# Patient Record
Sex: Female | Born: 1955 | Race: White | Hispanic: No | Marital: Married | Smoking: Never smoker
Health system: Southern US, Community
[De-identification: ages and names within clinical notes are randomized; demographics above are authoritative.]

---

## 2003-04-28 ENCOUNTER — Emergency Department (HOSPITAL_COMMUNITY): Admission: EM | Admit: 2003-04-28 | Discharge: 2003-04-29 | Payer: Self-pay | Admitting: Emergency Medicine

## 2003-05-04 ENCOUNTER — Emergency Department (HOSPITAL_COMMUNITY): Admission: EM | Admit: 2003-05-04 | Discharge: 2003-05-04 | Payer: Self-pay

## 2003-05-07 ENCOUNTER — Ambulatory Visit (HOSPITAL_COMMUNITY): Admission: RE | Admit: 2003-05-07 | Discharge: 2003-05-07 | Payer: Self-pay | Admitting: Internal Medicine

## 2004-03-01 ENCOUNTER — Ambulatory Visit: Payer: Self-pay

## 2004-03-08 ENCOUNTER — Ambulatory Visit (HOSPITAL_COMMUNITY): Admission: RE | Admit: 2004-03-08 | Discharge: 2004-03-08 | Payer: Self-pay | Admitting: Cardiology

## 2004-03-08 ENCOUNTER — Encounter: Payer: Self-pay | Admitting: Cardiology

## 2007-09-16 ENCOUNTER — Encounter: Admission: RE | Admit: 2007-09-16 | Discharge: 2007-09-16 | Payer: Self-pay | Admitting: Otolaryngology

## 2007-10-02 ENCOUNTER — Ambulatory Visit (HOSPITAL_COMMUNITY): Admission: RE | Admit: 2007-10-02 | Discharge: 2007-10-02 | Payer: Self-pay | Admitting: Otolaryngology

## 2007-10-09 ENCOUNTER — Inpatient Hospital Stay (HOSPITAL_COMMUNITY): Admission: AD | Admit: 2007-10-09 | Discharge: 2007-10-12 | Payer: Self-pay | Admitting: Otolaryngology

## 2008-01-27 ENCOUNTER — Encounter: Admission: RE | Admit: 2008-01-27 | Discharge: 2008-01-27 | Payer: Self-pay | Admitting: Family Medicine

## 2008-02-07 ENCOUNTER — Encounter: Admission: RE | Admit: 2008-02-07 | Discharge: 2008-02-07 | Payer: Self-pay | Admitting: Gastroenterology

## 2008-05-11 ENCOUNTER — Ambulatory Visit (HOSPITAL_COMMUNITY): Admission: RE | Admit: 2008-05-11 | Discharge: 2008-05-11 | Payer: Self-pay | Admitting: General Surgery

## 2008-07-28 ENCOUNTER — Encounter: Admission: RE | Admit: 2008-07-28 | Discharge: 2008-07-28 | Payer: Self-pay | Admitting: Family Medicine

## 2008-10-08 ENCOUNTER — Encounter: Admission: RE | Admit: 2008-10-08 | Discharge: 2008-10-08 | Payer: Self-pay | Admitting: Family Medicine

## 2009-03-27 HISTORY — PX: CHOLECYSTECTOMY: SHX55

## 2009-10-05 ENCOUNTER — Encounter: Admission: RE | Admit: 2009-10-05 | Discharge: 2009-10-05 | Payer: Self-pay | Admitting: Family Medicine

## 2010-04-17 ENCOUNTER — Encounter: Payer: Self-pay | Admitting: General Surgery

## 2010-04-17 ENCOUNTER — Encounter: Payer: Self-pay | Admitting: Otolaryngology

## 2010-04-18 ENCOUNTER — Encounter: Payer: Self-pay | Admitting: Family Medicine

## 2010-07-11 ENCOUNTER — Encounter: Payer: Self-pay | Admitting: Internal Medicine

## 2010-07-21 ENCOUNTER — Ambulatory Visit (INDEPENDENT_AMBULATORY_CARE_PROVIDER_SITE_OTHER): Payer: PRIVATE HEALTH INSURANCE | Admitting: Internal Medicine

## 2010-07-21 ENCOUNTER — Encounter: Payer: Self-pay | Admitting: Internal Medicine

## 2010-07-21 VITALS — Ht 62.0 in | Wt 104.0 lb

## 2010-07-21 DIAGNOSIS — R002 Palpitations: Secondary | ICD-10-CM | POA: Insufficient documentation

## 2010-07-21 NOTE — Progress Notes (Signed)
HPI: Barbara Gamble is a 55 y.o. female Seen at her own request after a hiatus of 7 years.  We had seen her before because of tachycardia palpitations. These were abrupt in onset and offset. Event recorder strips had suggested a long RP mechanism and has a few of these he had a P-wave morphologies are quite consistent with sinus. 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 And because of concerns about panic, she was referred icteric. She was started on Klonopin at that time and she remains on it still.  She had no symptoms for a lon  g time. In middle of February she was walking some children 2 art class. She had the abrupt onset of near syncope that was immediately followed by palpitations. This persisted for some 20 minutes. The nurse took her heart rate about 2 or 3 minutes into this episode and it was 120. She's had no further symptoms.  Current Outpatient Prescriptions  Medication Sig Dispense Refill  . Cholecalciferol (VITAMIN D3) 1000 UNITS CAPS Take by mouth.        . clonazePAM (KLONOPIN) 0.5 MG tablet Take 0.5 mg by mouth at bedtime as needed.        . docusate sodium (COLACE) 100 MG capsule Take 100 mg by mouth 2 (two) times daily.        . lansoprazole (PREVACID) 15 MG capsule Take 15 mg by mouth daily. As needed alt occ with aciphex       . psyllium (METAMUCIL) 58.6 % powder Take 1 packet by mouth daily.        . RABEprazole (ACIPHEX) 20 MG tablet Take 20 mg by mouth daily.          Allergies  Allergen Reactions  . Amoxicillin   . Zithromax (Azithromycin Dihydrate)     No past medical history on file.  No past surgical history on file.  No family history on file.  History   Social History  . Marital Status: Married    Spouse Name: N/A    Number of Children: N/A  . Years of Education: N/A   Occupational History  . Not on file.    Social History Main Topics  . Smoking status: Never Smoker   . Smokeless tobacco: Never Used  . Alcohol Use: Not on file  . Drug Use: Not on file  . Sexually Active: Not on file   Other Topics Concern  . Not on file   Social History Narrative  . No narrative on file   Past medical history is noted for GE reflux disease Past surgical history is negative. 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 She has one daughter   Fourteen point review of systems was negative except as noted in HPI and PMH   PHYSICAL EXAMINATION  Height 5\' 2"  (1.575 m), weight 104 lb (47.174 kg).   Well developed and nourished middle-age Caucasian female appearing her stated age HENT normal Neck supple with JVP-flat Carotids brisk and full without bruits Back without scoliosis or kyphosis Clear Regular rate and rhythm, no murmurs or gallops Abd-soft with active BS without hepatomegaly or midline pulsation Femoral pulses 2+ distal pulses intact No Clubbing cyanosis edema Skin-warm and dry LN-neg submandibular and supraclavicular A & Oriented CN 3-12 normal  Grossly normal sensory and motor function Affect engaging .  ECG  Sinus 99 .13/.08/.43 RAE-unchanged 2005

## 2010-07-21 NOTE — Assessment & Plan Note (Signed)
The patient had an episode of palpitations. It was self-limited. It is the first in many years. 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 There is a great deal of co-occurring anxiety suggesting that the latter was a more appropriate diagnosis.    This episode occurred quite abruptly. That doesn't necessarily help [place]. The infrequency of these episodes however is suggestive of an primary arrhythmia issue as opposed to an autonomic issue.  Again given the infrequency of the episode and the similarity to prior documented rhythms, I have reassured the patient that this is a non-life-threatening event. We will plan to wait and see at what frequency these recur and for right now we will withhold any medications

## 2010-07-26 ENCOUNTER — Encounter: Payer: Self-pay | Admitting: Internal Medicine

## 2010-08-12 NOTE — Discharge Summary (Signed)
Barbara Gamble, Barbara Gamble                  ACCOUNT NO.:  [id number]   MEDICAL RECORD NO.:  [id number]          PATIENT TYPE:  INP   LOCATION:  5127                         FACILITY:  [facility]   PHYSICIAN:  Lucky Cowboy, MD         DATE OF BIRTH:  [birth year]   DATE OF ADMISSION:  10/09/2007  DATE OF DISCHARGE:  10/12/2007                               DISCHARGE SUMMARY   DISCHARGE DIAGNOSES:  1. Postoperative tonsillectomy, dehydration.  2. Nausea.  3. Fever of unknown etiology.   HOSPITAL COURSE:  This patient is a 55 year old female who underwent  tonsillectomy on October 03, 2007, due to chronic tonsillitis.  Postoperatively, she has had fever as high as 102.0.  There has been  severe nausea along the pain medicines, now controlled with Zofran.  She  is admitted for hydration and nausea control, status post right  tonsillectomy.  While in the hospital, she was hydrated.  She was noted  to have persistent fever despite hydration.  White blood cell count was  normal.  Chest x-ray was without infiltrate.  There is no improvement in  the patient's status despite hydration and for these reasons, further  workup was initiated.  Urinalysis was normal.  Further, the patient  experienced diarrhea with cramps, which was a significant problem.  White blood cell count was reevaluated and found to be in the normal  range.  The patient was carefully observed and did improve with oral  intake, decreased nausea just prior to the day of discharge, which is  October 12, 2007.  Further, a C. diff testing was negative.  The patient  will be followed up in the office.  If continue fevers, she will call as  well.      Lucky Cowboy, MD  Electronically Signed     SJ/MEDQ  D:  11/28/2007  T:  11/29/2007  Job:  [id number]   cc:   [facility]

## 2010-12-05 ENCOUNTER — Ambulatory Visit (INDEPENDENT_AMBULATORY_CARE_PROVIDER_SITE_OTHER): Payer: 59 | Admitting: General Surgery

## 2010-12-05 ENCOUNTER — Encounter (INDEPENDENT_AMBULATORY_CARE_PROVIDER_SITE_OTHER): Payer: Self-pay | Admitting: General Surgery

## 2010-12-05 DIAGNOSIS — K649 Unspecified hemorrhoids: Secondary | ICD-10-CM

## 2010-12-05 NOTE — Patient Instructions (Signed)
I found  very tiny external hemorrhoids which are not infected. Internally there was no fissure, and no significant hemorrhoids. There was one tiny area of skin in the posterior midline well away from the rectum that seemed to bleed when touched. It should heal on its own. I advise you  to continue taking Metamucil twice a day and stay hydrated. I recommend that you use the hemorrhoid cream twice a day, since it seems to be helping. Return to see me if there are any new problems.

## 2010-12-05 NOTE — Progress Notes (Signed)
Chief Complaint  Patient presents with  . Follow-up    anal fissure    HPI Barbara Gamble is a 55 y.o. female.  This patient returns with rectal symptoms. She is self-referred this time.  Recall that I saw her on September 01, 2010 for rectal pain and bleeding.Her physical exam and anoscopy were unremarkable although the history was suggestive of intermittent anal fissure. She was given Analpram-HC 2.5% cream and became asymptomatic.  More recently she has been having a little bit of bleeding from time to time and a little bit of discomfort when she has a bowel movement but this is just a  minor soreness. There's been no severe pain. She's going back using Analpram-HC 2.5 to cream and her symptoms have actually subsided now. She was anxious and wanted to be reexamined. HPI  No past medical history on file.  No past surgical history on file.  No family history on file.  Social History History  Substance Use Topics  . Smoking status: Never Smoker   . Smokeless tobacco: Never Used  . Alcohol Use: Not on file    Allergies  Allergen Reactions  . Amoxicillin   . Zithromax (Azithromycin Dihydrate)     Current Outpatient Prescriptions  Medication Sig Dispense Refill  . Cholecalciferol (VITAMIN D3) 1000 UNITS CAPS Take by mouth.        . clonazePAM (KLONOPIN) 0.5 MG tablet Take 0.5 mg by mouth at bedtime as needed.        . docusate sodium (COLACE) 100 MG capsule Take 100 mg by mouth 2 (two) times daily.        . lansoprazole (PREVACID) 15 MG capsule Take 15 mg by mouth daily. As needed alt occ with aciphex       . psyllium (METAMUCIL) 58.6 % powder Take 1 packet by mouth daily.          Review of Systems Review of Systems 10 system review of systems is performed and is negative except as described above. Blood pressure 120/72, pulse 100, temperature 98 F (36.7 C), temperature source Temporal.  Physical Exam Physical Exam  Constitutional: She is oriented to person, place, and time.   Neck: Normal range of motion. Neck supple. No tracheal deviation present.  Cardiovascular: Normal rate and regular rhythm.   Pulmonary/Chest: Effort normal and breath sounds normal. No respiratory distress. She exhibits no tenderness.  Abdominal: Bowel sounds are normal. She exhibits no distension and no mass. There is no tenderness. There is no rebound and no guarding.       Well healed lap chole scars.  Genitourinary:     Lymphadenopathy:    She has no cervical adenopathy.  Neurological: She is alert and oriented to person, place, and time. She exhibits normal muscle tone.  Skin: Skin is warm and dry. No rash noted. No erythema. No pallor.  Psychiatric: She has a normal mood and affect. Her behavior is normal. Judgment and thought content normal.       Anxious.    Data Reviewed Old records in [facility] chart. Assessment    Intermittent rectal pain and bleeding. Symptoms are minimal and currently essentially asymptomatic. With an essentially normal physical exam I am not sure what is going on, but occluded no surgical intervention is warranted. I suspect this may be intermittent flareup of hemorrhoids.    Plan    The patient was reassured.  Analpram HC 2.5% cream twice daily.  Metamucil twice daily. Emphasis on hydration.  Return to  see me p.r.n.       Federico Maiorino M 12/05/2010, 12:26 PM

## 2010-12-20 ENCOUNTER — Other Ambulatory Visit: Payer: Self-pay | Admitting: Family Medicine

## 2010-12-20 DIAGNOSIS — R1011 Right upper quadrant pain: Secondary | ICD-10-CM

## 2010-12-23 ENCOUNTER — Other Ambulatory Visit: Payer: Self-pay

## 2010-12-23 LAB — DIFFERENTIAL
Basophils Absolute: 0
Basophils Absolute: 0
Basophils Relative: 1
Eosinophils Absolute: 0.1
Eosinophils Relative: 1
Eosinophils Relative: 2
Lymphocytes Relative: 15
Lymphs Abs: 1
Monocytes Absolute: 0.6
Monocytes Relative: 13 — ABNORMAL HIGH
Neutro Abs: 3.5
Neutrophils Relative %: 71

## 2010-12-23 LAB — BASIC METABOLIC PANEL
BUN: 8
CO2: 26
Chloride: 98
Creatinine, Ser: 0.76
Glucose, Bld: 111 — ABNORMAL HIGH
Potassium: 3.8

## 2010-12-23 LAB — CBC
HCT: 37.9
HCT: 40
Hemoglobin: 13.7
MCHC: 34.3
MCHC: 34.4
MCV: 90
Platelets: 356
RBC: 4.47
RDW: 12.3
RDW: 12.4

## 2010-12-23 LAB — URINALYSIS, ROUTINE W REFLEX MICROSCOPIC
Bilirubin Urine: NEGATIVE
Glucose, UA: NEGATIVE
Ketones, ur: NEGATIVE
Protein, ur: NEGATIVE
pH: 7.5

## 2010-12-23 LAB — URINE CULTURE: Colony Count: >=100000

## 2010-12-23 LAB — CLOSTRIDIUM DIFFICILE EIA: C difficile Toxins A+B, EIA: NEGATIVE

## 2011-01-03 ENCOUNTER — Other Ambulatory Visit: Payer: Self-pay

## 2011-01-11 ENCOUNTER — Other Ambulatory Visit: Payer: Self-pay

## 2011-01-31 ENCOUNTER — Other Ambulatory Visit: Payer: Self-pay | Admitting: Family Medicine

## 2011-02-07 ENCOUNTER — Ambulatory Visit
Admission: RE | Admit: 2011-02-07 | Discharge: 2011-02-07 | Disposition: A | Discharge status: Home / Self Care | Payer: 59 | Source: Ambulatory Visit | Attending: Family Medicine | Admitting: Family Medicine

## 2011-05-12 ENCOUNTER — Other Ambulatory Visit (HOSPITAL_COMMUNITY): Payer: Self-pay | Admitting: Gastroenterology

## 2011-05-12 DIAGNOSIS — K219 Gastro-esophageal reflux disease without esophagitis: Secondary | ICD-10-CM

## 2011-05-30 ENCOUNTER — Other Ambulatory Visit (HOSPITAL_COMMUNITY): Payer: 59

## 2011-08-04 ENCOUNTER — Encounter (INDEPENDENT_AMBULATORY_CARE_PROVIDER_SITE_OTHER): Payer: Self-pay | Admitting: General Surgery

## 2011-08-04 ENCOUNTER — Ambulatory Visit (INDEPENDENT_AMBULATORY_CARE_PROVIDER_SITE_OTHER): Payer: 59 | Admitting: General Surgery

## 2011-08-04 VITALS — BP 126/72 | HR 86 | Temp 99.2°F | Resp 16 | Ht 62.0 in | Wt 103.4 lb

## 2011-08-04 DIAGNOSIS — K602 Anal fissure, unspecified: Secondary | ICD-10-CM

## 2011-08-04 NOTE — Progress Notes (Signed)
Patient ID: Barbara Gamble, female   DOB: 04/22/1955, 56 y.o.   MRN: [id number]  Chief Complaint  Patient presents with  . Rectal Problems    Anal Fissure    HPI Barbara Gamble is a 56 y.o. female.  She returns with intermittent rectal symptoms.  I initially saw this patient in July 2012 with a history of a strongly suggested intermittent anal fissure. She has some skin tags but no active fissure. She was treated medically and got better. She returned in September 2012 again with symptoms that were improving with topical therapy and did not find a fissure she had tiny hemorrhoids. She got better again on topical therapy.  She now returns stating that for 4 or 5 weeks she's had recurrent symptoms,very similar. She has little bit of bleeding when she has a bowel movement and has some pain during and for short time after a bowel movement. The steroid cream does help her.  Last colonoscopy 2010. HPI  History reviewed. No pertinent past medical history.  Past Surgical History  Procedure Date  . Cholecystectomy 2011    Family History  Problem Relation Age of Onset  . Cancer Maternal Grandmother     Lung    Social History History  Substance Use Topics  . Smoking status: Never Smoker   . Smokeless tobacco: Never Used  . Alcohol Use: No    Allergies  Allergen Reactions  . Amoxicillin   . Zithromax (Azithromycin Dihydrate)     Current Outpatient Prescriptions  Medication Sig Dispense Refill  . CARAFATE 1 GM/10ML suspension as needed.      . Cholecalciferol (VITAMIN D3) 1000 UNITS CAPS Take by mouth.        . clonazePAM (KLONOPIN) 0.5 MG tablet Take 0.5 mg by mouth at bedtime as needed.        . docusate sodium (COLACE) 100 MG capsule Take 100 mg by mouth 2 (two) times daily.        . psyllium (METAMUCIL) 58.6 % powder Take 1 packet by mouth daily.        Marland Kitchen DEXILANT 60 MG capsule         Review of Systems Review of Systems  Constitutional: Negative for fever, chills and  unexpected weight change.  HENT: Negative for hearing loss, congestion, sore throat, trouble swallowing and voice change.   Eyes: Negative for visual disturbance.  Respiratory: Negative for cough and wheezing.   Cardiovascular: Positive for palpitations. Negative for chest pain and leg swelling.  Gastrointestinal: Positive for anal bleeding and rectal pain. Negative for nausea, vomiting, abdominal pain, diarrhea, constipation, blood in stool and abdominal distention.  Genitourinary: Negative for hematuria, vaginal bleeding and difficulty urinating.  Musculoskeletal: Negative for arthralgias.  Skin: Negative for rash and wound.  Neurological: Negative for seizures, syncope and headaches.  Hematological: Negative for adenopathy. Does not bruise/bleed easily.  Psychiatric/Behavioral: Negative for confusion.    Blood pressure 126/72, pulse 86, temperature 99.2 F (37.3 C), temperature source Temporal, resp. rate 16, height 5\' 2"  (1.575 m), weight 103 lb 6.4 oz (46.902 kg).  Physical Exam Physical Exam  Constitutional: She is oriented to person, place, and time. She appears well-developed and well-nourished. No distress.  HENT:  Head: Normocephalic.  Eyes: Conjunctivae and EOM are normal. Pupils are equal, round, and reactive to light. Left eye exhibits no discharge. No scleral icterus.  Neck: Neck supple. No JVD present. No tracheal deviation present. No thyromegaly present.  Cardiovascular: Normal rate, regular rhythm, normal heart  sounds and intact distal pulses.   No murmur heard. Pulmonary/Chest: Effort normal and breath sounds normal. No respiratory distress. She has no wheezes. She has no rales. She exhibits no tenderness.  Abdominal: Soft. Bowel sounds are normal. She exhibits no distension and no mass. There is no tenderness. There is no rebound and no guarding.  Genitourinary:       External anal exam shows no evidence of dermatitis, or fistula. There is a small sentinel tag  posterior midline. A slightly larger sentinel tag anterior midline. No blood seen. Digital exam reveals increased sphincter tone but no severe focal pain or trigger point. No palpable mass. Anoscopy was accomplished  slowly. The patient was very cooperative. Minimal internal hemorrhoids. Some redness at the anal verge posterior midline but no open fissure. No bleeding.  Musculoskeletal: She exhibits no edema and no tenderness.  Lymphadenopathy:    She has no cervical adenopathy.  Neurological: She is alert and oriented to person, place, and time. She exhibits normal muscle tone. Coordination normal.  Skin: Skin is warm. No rash noted. She is not diaphoretic. No erythema. No pallor.  Psychiatric: She has a normal mood and affect. Her behavior is normal. Judgment and thought content normal.    Data Reviewed Old chart  Assessment    I strongly suspect that she is having intermittent symptoms from intermittent acute and chronic anal fissure. Sentinel tags anterior and posterior midline are strongly suggestive. History is classic.    Plan    We had a long talk about management. I told her that certainly she could continue medical management this but that she would probably intermittently have symptoms indefinitely. I told her that she could be referred to someone else for a second opinion. She was not interested in that. I told her that it would be very reasonable to take her to the operating room for examination under anesthesia, anal dilatation, lateral internal sphincterotomy and excision of the sentinel tags. This would probably markedly improve or resolve her problem, although I could not guarantee that.  We had a long discussion about this. She thinks she wants to do this wants to go home and read about it and think about it a little bit more before making a decision. I told that was perfectly fine and there was no emergency.  I gave her a prescription for Analpram-HC 2.5% cream. I gave her  patient information booklet of anal fissure and encouraged her to look at the website for the American Society of colorectal surgeons.  She will contact me p.r.n.       Angelia Mould. Derrell Lolling, M.D., [facility] General and Minimally invasive Surgery Breast and Colorectal Surgery Office:   [phone] Pager:   [phone]  08/04/2011, 2:20 PM

## 2011-08-04 NOTE — Patient Instructions (Signed)
Your rectal bleeding and pain continues to be an intermittent problem. I think that you are intermittently opening up an anal fissure. I can see external evidence of chronic changes in the skin called a sentinel tag or sentinel pile. You did not have an infection.  We have discussed the pros and cons of continued medical therapy as well as definitive surgical management.  Please call me back when you decide whether you want to have the surgery or not. In the meantime take Metamucil twice a day, drink lots of water, and used the steroid cream that I gave you.

## 2012-03-01 ENCOUNTER — Ambulatory Visit: Payer: 59 | Admitting: Internal Medicine

## 2013-04-07 ENCOUNTER — Other Ambulatory Visit: Payer: Self-pay

## 2013-04-07 DIAGNOSIS — Z1231 Encounter for screening mammogram for malignant neoplasm of breast: Secondary | ICD-10-CM

## 2013-05-01 ENCOUNTER — Ambulatory Visit
Admission: RE | Admit: 2013-05-01 | Discharge: 2013-05-01 | Disposition: A | Discharge status: Home / Self Care | Payer: 59 | Source: Ambulatory Visit

## 2013-05-01 DIAGNOSIS — Z1231 Encounter for screening mammogram for malignant neoplasm of breast: Secondary | ICD-10-CM

## 2014-05-25 ENCOUNTER — Other Ambulatory Visit: Payer: Self-pay

## 2014-05-25 DIAGNOSIS — Z1231 Encounter for screening mammogram for malignant neoplasm of breast: Secondary | ICD-10-CM

## 2014-06-18 ENCOUNTER — Ambulatory Visit
Admission: RE | Admit: 2014-06-18 | Discharge: 2014-06-18 | Disposition: A | Discharge status: Home / Self Care | Payer: 59 | Source: Ambulatory Visit

## 2014-06-18 DIAGNOSIS — Z1231 Encounter for screening mammogram for malignant neoplasm of breast: Secondary | ICD-10-CM

## 2015-06-23 ENCOUNTER — Other Ambulatory Visit: Payer: Self-pay

## 2015-06-23 DIAGNOSIS — Z1231 Encounter for screening mammogram for malignant neoplasm of breast: Secondary | ICD-10-CM

## 2015-07-06 ENCOUNTER — Ambulatory Visit
Admission: RE | Admit: 2015-07-06 | Discharge: 2015-07-06 | Disposition: A | Discharge status: Home / Self Care | Payer: 59 | Source: Ambulatory Visit

## 2015-07-06 DIAGNOSIS — Z1231 Encounter for screening mammogram for malignant neoplasm of breast: Secondary | ICD-10-CM

## 2016-06-20 ENCOUNTER — Other Ambulatory Visit: Payer: Self-pay | Admitting: Obstetrics and Gynecology

## 2016-06-20 DIAGNOSIS — Z1231 Encounter for screening mammogram for malignant neoplasm of breast: Secondary | ICD-10-CM

## 2016-07-07 ENCOUNTER — Ambulatory Visit
Admission: RE | Admit: 2016-07-07 | Discharge: 2016-07-07 | Disposition: A | Discharge status: Home / Self Care | Payer: BLUE CROSS/BLUE SHIELD | Source: Ambulatory Visit | Attending: Obstetrics and Gynecology | Admitting: Obstetrics and Gynecology

## 2016-07-07 DIAGNOSIS — Z1231 Encounter for screening mammogram for malignant neoplasm of breast: Secondary | ICD-10-CM

## 2016-07-11 ENCOUNTER — Telehealth: Payer: Self-pay

## 2016-07-17 ENCOUNTER — Other Ambulatory Visit: Payer: Self-pay | Admitting: Obstetrics and Gynecology

## 2016-07-17 DIAGNOSIS — R928 Other abnormal and inconclusive findings on diagnostic imaging of breast: Secondary | ICD-10-CM

## 2016-07-19 ENCOUNTER — Other Ambulatory Visit: Payer: BLUE CROSS/BLUE SHIELD

## 2016-07-19 ENCOUNTER — Ambulatory Visit
Admission: RE | Admit: 2016-07-19 | Discharge: 2016-07-19 | Disposition: A | Discharge status: Home / Self Care | Payer: BLUE CROSS/BLUE SHIELD | Source: Ambulatory Visit | Attending: Obstetrics and Gynecology | Admitting: Obstetrics and Gynecology

## 2016-07-19 DIAGNOSIS — R928 Other abnormal and inconclusive findings on diagnostic imaging of breast: Secondary | ICD-10-CM

## 2016-07-20 ENCOUNTER — Other Ambulatory Visit: Payer: BLUE CROSS/BLUE SHIELD

## 2016-12-06 ENCOUNTER — Other Ambulatory Visit: Payer: Self-pay | Admitting: Obstetrics and Gynecology

## 2016-12-06 DIAGNOSIS — N63 Unspecified lump in unspecified breast: Secondary | ICD-10-CM

## 2017-01-19 ENCOUNTER — Ambulatory Visit
Admission: RE | Admit: 2017-01-19 | Discharge: 2017-01-19 | Disposition: A | Discharge status: Home / Self Care | Payer: BLUE CROSS/BLUE SHIELD | Source: Ambulatory Visit | Attending: Obstetrics and Gynecology | Admitting: Obstetrics and Gynecology

## 2017-01-19 DIAGNOSIS — N63 Unspecified lump in unspecified breast: Secondary | ICD-10-CM

## 2017-06-22 ENCOUNTER — Other Ambulatory Visit: Payer: Self-pay | Admitting: Obstetrics and Gynecology

## 2017-06-22 DIAGNOSIS — Z1231 Encounter for screening mammogram for malignant neoplasm of breast: Secondary | ICD-10-CM

## 2017-07-12 ENCOUNTER — Ambulatory Visit: Payer: BLUE CROSS/BLUE SHIELD

## 2017-07-23 ENCOUNTER — Ambulatory Visit
Admission: RE | Admit: 2017-07-23 | Discharge: 2017-07-23 | Disposition: A | Discharge status: Home / Self Care | Payer: BLUE CROSS/BLUE SHIELD | Source: Ambulatory Visit | Attending: Obstetrics and Gynecology | Admitting: Obstetrics and Gynecology

## 2017-07-23 DIAGNOSIS — Z1231 Encounter for screening mammogram for malignant neoplasm of breast: Secondary | ICD-10-CM

## 2017-07-25 ENCOUNTER — Other Ambulatory Visit: Payer: Self-pay | Admitting: Obstetrics and Gynecology

## 2017-07-25 DIAGNOSIS — R928 Other abnormal and inconclusive findings on diagnostic imaging of breast: Secondary | ICD-10-CM

## 2017-07-27 ENCOUNTER — Other Ambulatory Visit: Payer: Self-pay | Admitting: Obstetrics and Gynecology

## 2017-07-27 ENCOUNTER — Ambulatory Visit
Admission: RE | Admit: 2017-07-27 | Discharge: 2017-07-27 | Disposition: A | Discharge status: Home / Self Care | Payer: BLUE CROSS/BLUE SHIELD | Source: Ambulatory Visit | Attending: Obstetrics and Gynecology | Admitting: Obstetrics and Gynecology

## 2017-07-27 DIAGNOSIS — R928 Other abnormal and inconclusive findings on diagnostic imaging of breast: Secondary | ICD-10-CM

## 2017-07-27 DIAGNOSIS — N6310 Unspecified lump in the right breast, unspecified quadrant: Secondary | ICD-10-CM

## 2018-01-29 ENCOUNTER — Ambulatory Visit
Admission: RE | Admit: 2018-01-29 | Discharge: 2018-01-29 | Disposition: A | Discharge status: Home / Self Care | Payer: BLUE CROSS/BLUE SHIELD | Source: Ambulatory Visit | Attending: Obstetrics and Gynecology | Admitting: Obstetrics and Gynecology

## 2018-01-29 DIAGNOSIS — N6310 Unspecified lump in the right breast, unspecified quadrant: Secondary | ICD-10-CM

## 2018-05-03 IMAGING — MG 2D DIGITAL DIAGNOSTIC BILATERAL MAMMOGRAM WITH CAD AND ADJUNCT T
8 of 17 series · 8 of 40 positions shown · non-contrast
Comparison: Previous exam(s).

CLINICAL DATA: Screening recall for possible masses in the right
breast and an asymmetry in the left breast.

EXAM:
2D DIGITAL DIAGNOSTIC BILATERAL MAMMOGRAM WITH CAD AND ADJUNCT TOMO
BILATERAL BREAST ULTRASOUND

[R CC synth-2D]
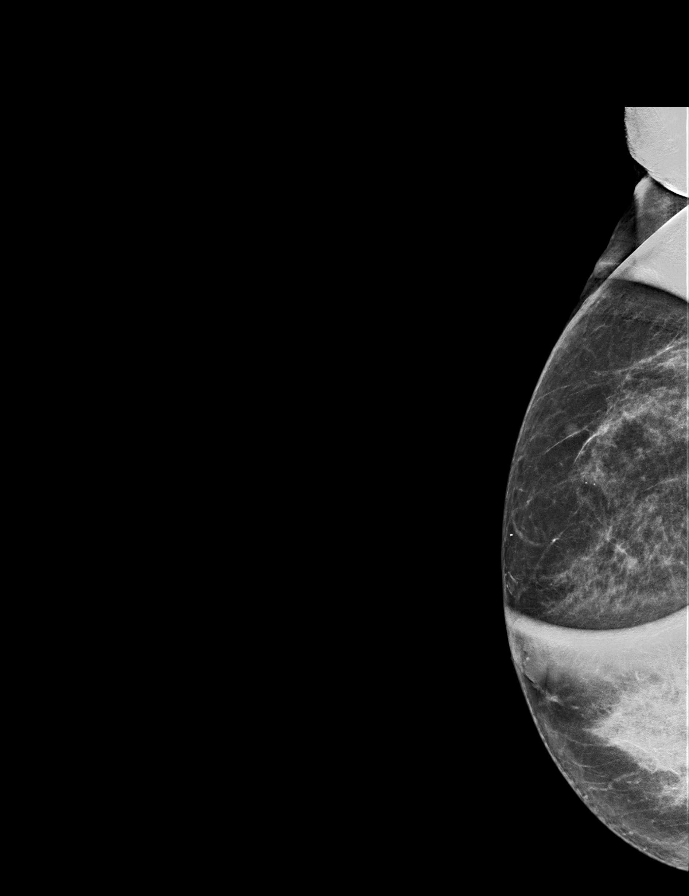

[R CC]
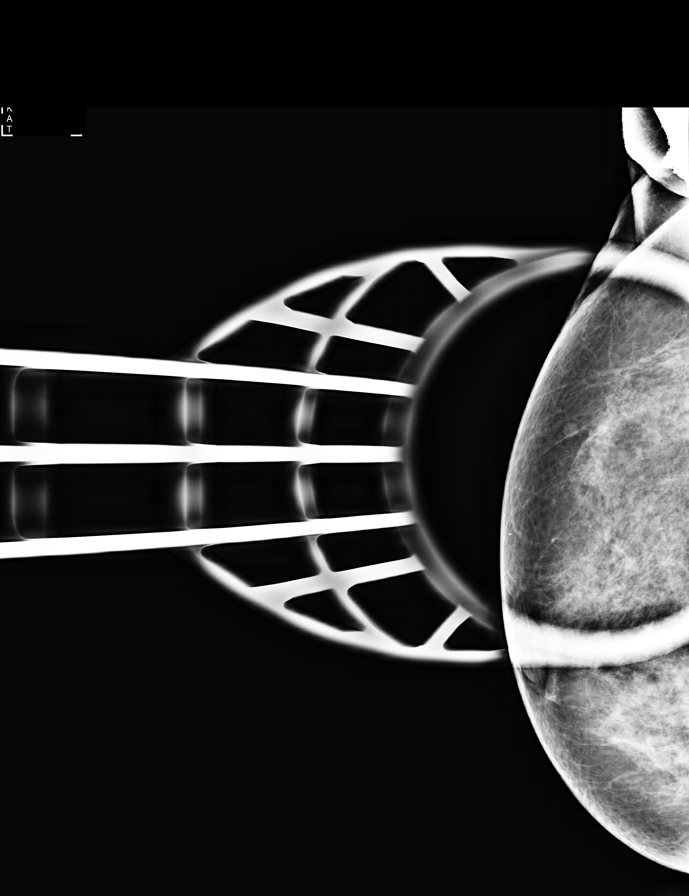

[R MLO]
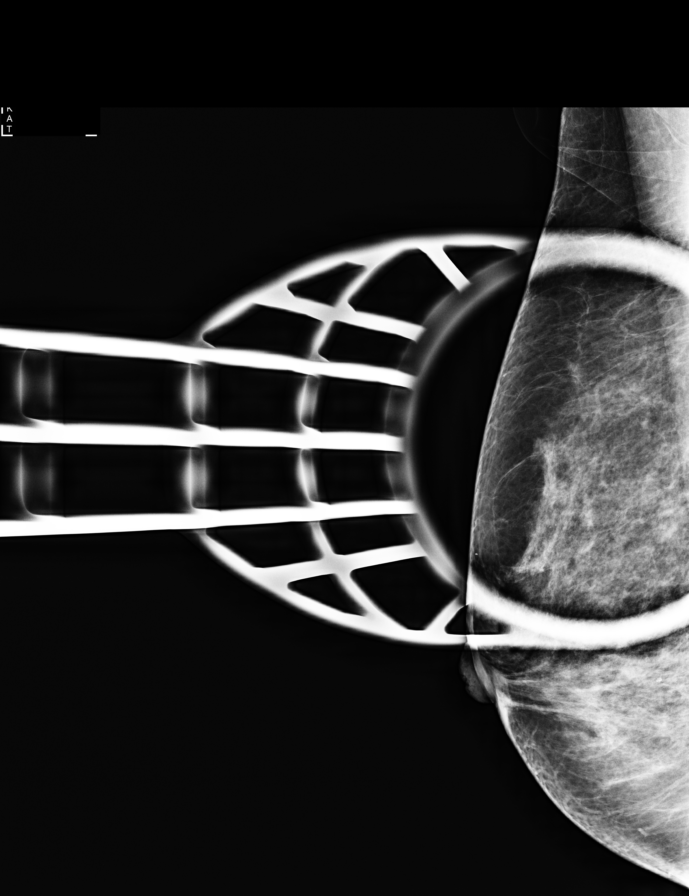

[L MLO (1 of 2)]
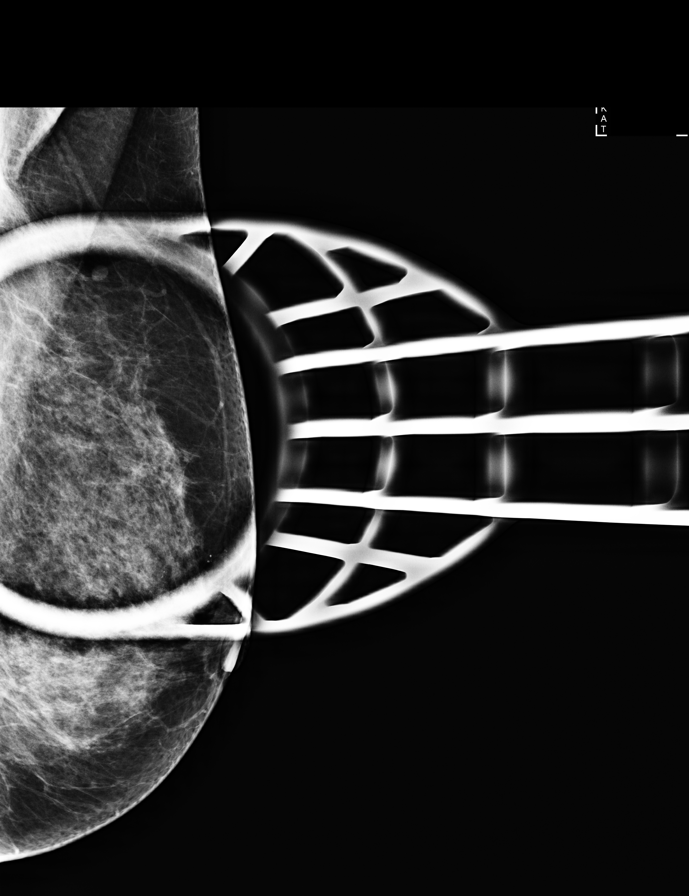

[L MLO (2 of 2)]
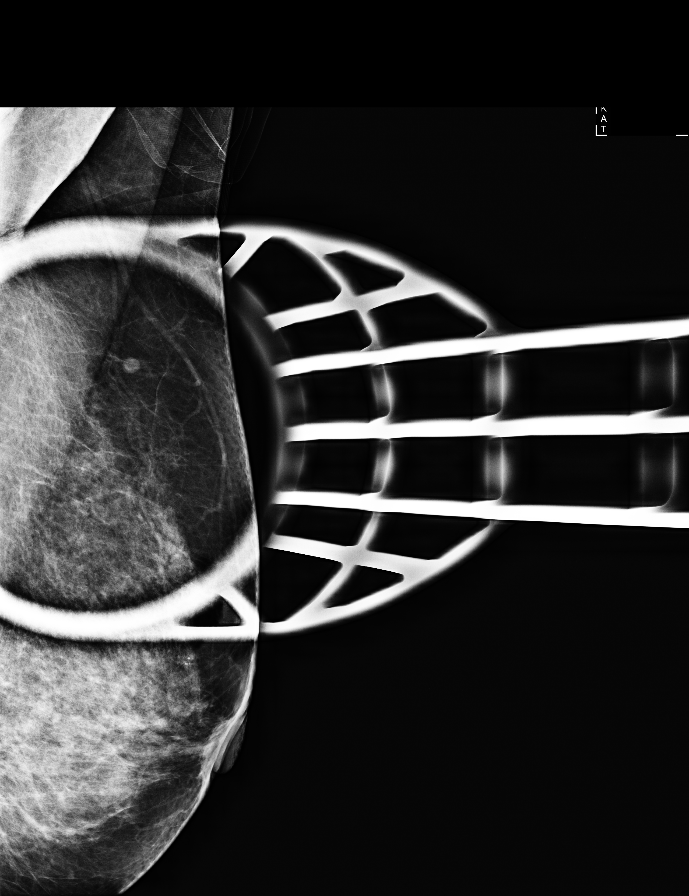

[R ML synth-2D]
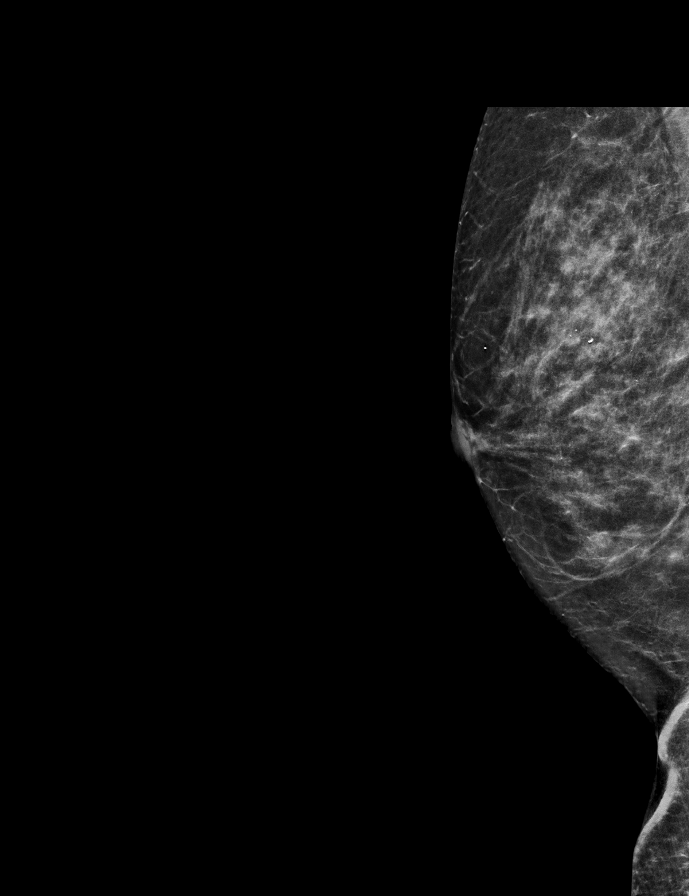

[R ML]
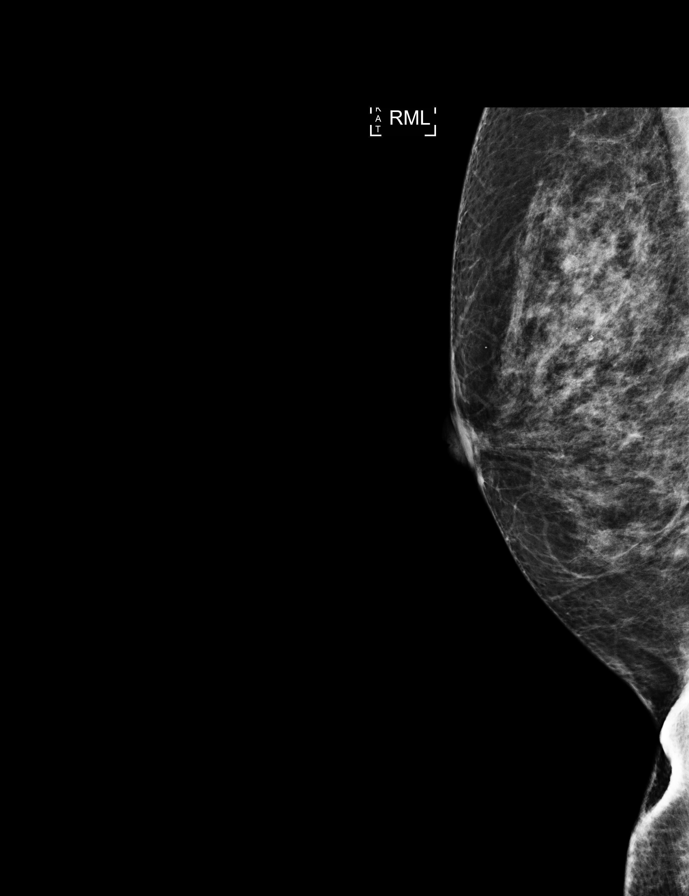

[L ML]
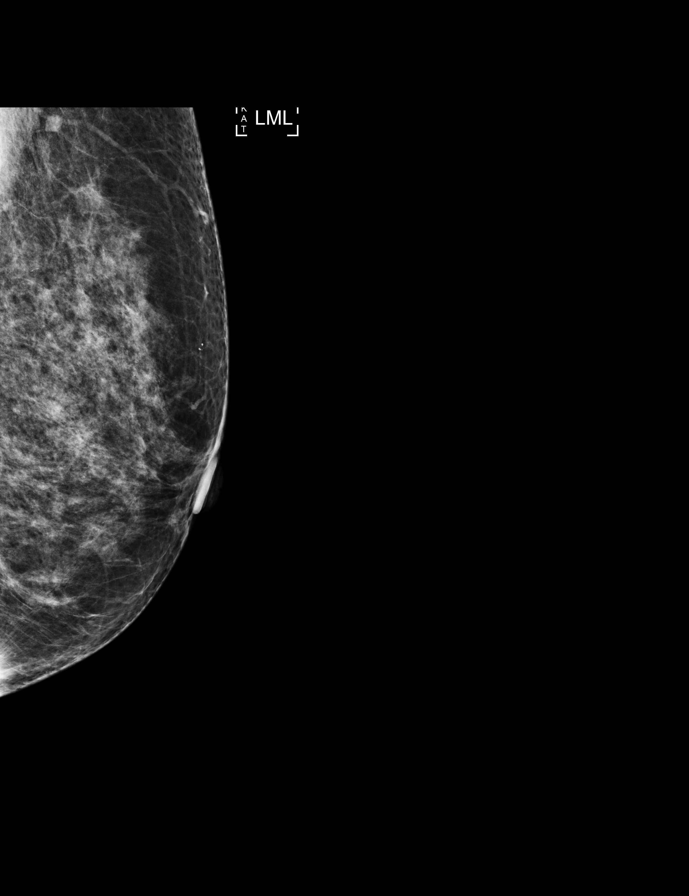

[8 of 40 positions shown; findings below may reference images not displayed]

ACR Breast Density Category c: The breast tissue is heterogeneously
dense, which may obscure small masses.
FINDINGS: In the upper-outer quadrant of the right breast there is a
circumscribed oval mass measuring approximately 4 mm. No definite
masses are identified in the superior aspect of the left breast
mammographically at the site of concern on the screening mammogram.
There is a mass farther superior in the left breast measuring
approximately 4 mm which has been stable since at least 2400 and is
likely a lymph node.

Mammographic images were processed with CAD.

Ultrasound of the right breast at 10 o'clock, 4 cm from the nipple
demonstrates a circumscribed anechoic oval mass measuring 3 x 2 x 4
mm consistent with a benign cyst.

Ultrasound of the left breast at 1 o'clock, 6 cm from the nipple
demonstrates a circumscribed hypoechoic oval mass measuring 3 x 2 x
2 mm, and has a probably benign appearance. This likely does not
correspond with the abnormality identified on the screening
mammogram, however ultrasound of the remainder of the upper-outer
left breast demonstrates no suspicious masses.
IMPRESSION: 1. The mass in the upper-outer right breast corresponds with a
benign cyst.

2. There is no persistent mass identified mammographically or
sonographically in the left breast at the site of concern identified
on the screening mammogram.

3.  There is a probably benign left breast mass at 1 o'clock.

RECOMMENDATION:
1. Six-month follow-up diagnostic left breast mammogram and
ultrasound is recommended.

I have discussed the findings and recommendations with the patient.
Results were also provided in writing at the conclusion of the
visit. If applicable, a reminder letter will be sent to the patient
regarding the next appointment.

BI-RADS CATEGORY  3: Probably benign.

## 2018-08-20 ENCOUNTER — Other Ambulatory Visit: Payer: Self-pay | Admitting: Obstetrics and Gynecology

## 2018-08-20 DIAGNOSIS — Z1231 Encounter for screening mammogram for malignant neoplasm of breast: Secondary | ICD-10-CM

## 2018-09-21 ENCOUNTER — Other Ambulatory Visit: Payer: Self-pay

## 2018-09-21 ENCOUNTER — Ambulatory Visit
Admission: RE | Admit: 2018-09-21 | Discharge: 2018-09-21 | Disposition: A | Discharge status: Home / Self Care | Payer: BLUE CROSS/BLUE SHIELD | Source: Ambulatory Visit | Attending: Obstetrics and Gynecology | Admitting: Obstetrics and Gynecology

## 2018-09-21 DIAGNOSIS — Z1231 Encounter for screening mammogram for malignant neoplasm of breast: Secondary | ICD-10-CM

## 2018-09-24 ENCOUNTER — Other Ambulatory Visit: Payer: Self-pay | Admitting: Obstetrics and Gynecology

## 2018-09-24 DIAGNOSIS — R921 Mammographic calcification found on diagnostic imaging of breast: Secondary | ICD-10-CM

## 2018-10-01 ENCOUNTER — Ambulatory Visit
Admission: RE | Admit: 2018-10-01 | Discharge: 2018-10-01 | Disposition: A | Discharge status: Home / Self Care | Payer: BC Managed Care – PPO | Source: Ambulatory Visit | Attending: Obstetrics and Gynecology | Admitting: Obstetrics and Gynecology

## 2018-10-01 DIAGNOSIS — R921 Mammographic calcification found on diagnostic imaging of breast: Secondary | ICD-10-CM

## 2019-05-07 IMAGING — MG DIGITAL SCREENING BILATERAL MAMMOGRAM WITH TOMO AND CAD
8 series · 9 of 24 positions shown · non-contrast
Comparison: Previous exam(s).

CLINICAL DATA: Screening.

EXAM:
DIGITAL SCREENING BILATERAL MAMMOGRAM WITH TOMO AND CAD

[R MLO synth-2D]
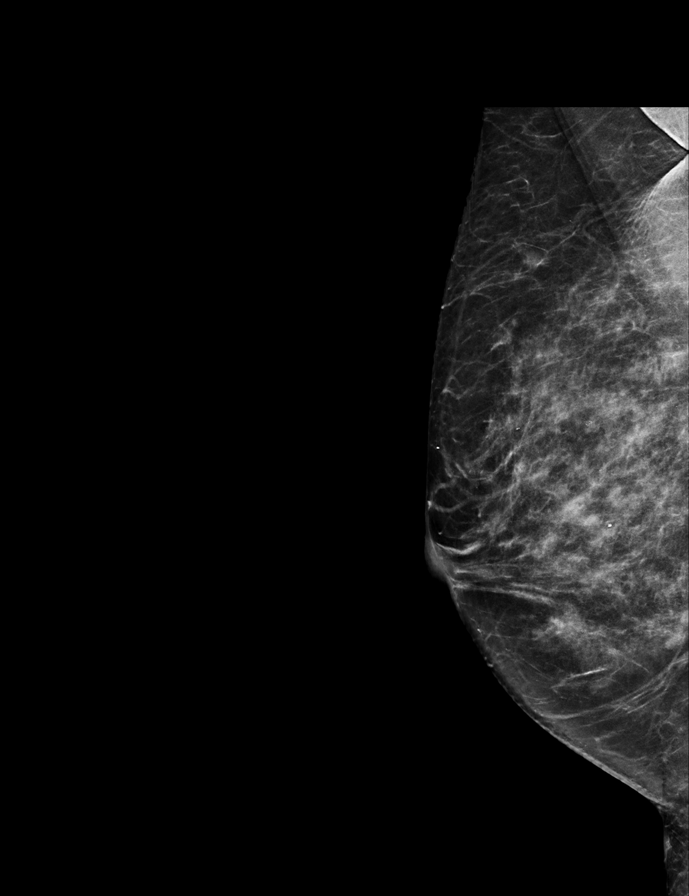

[L MLO synth-2D]
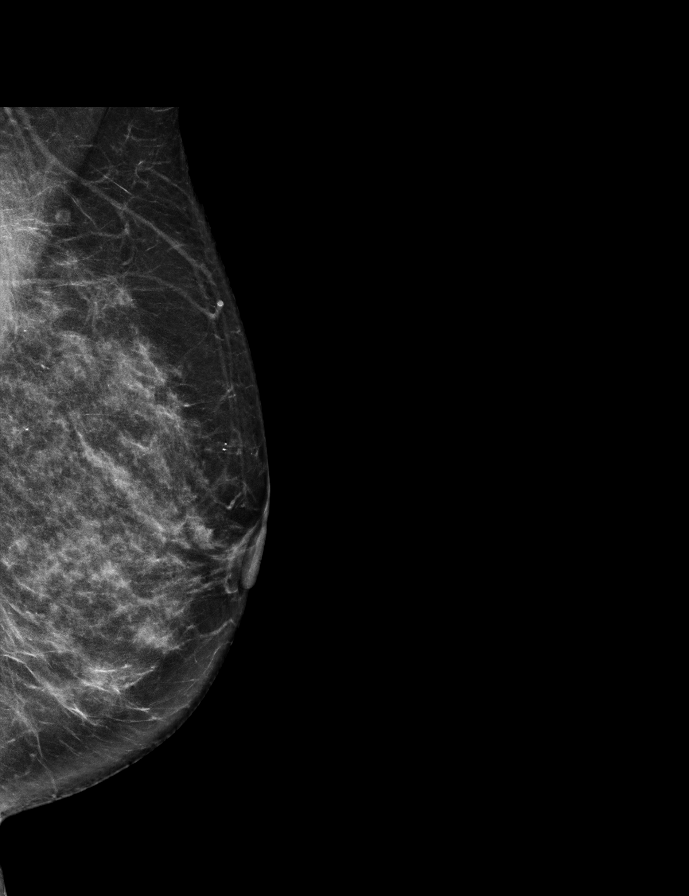

[R CC synth-2D]
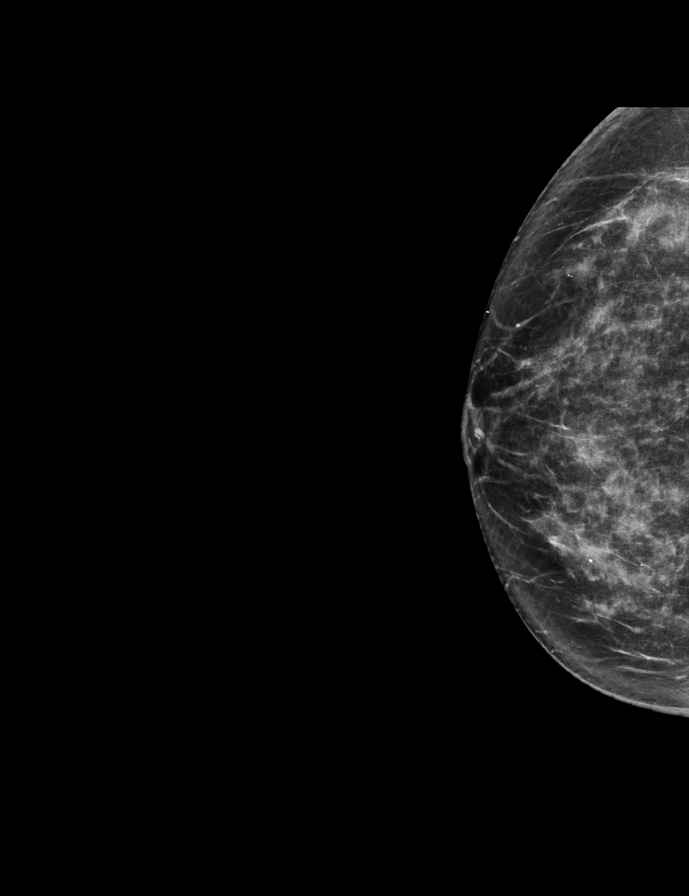

[L CC synth-2D]
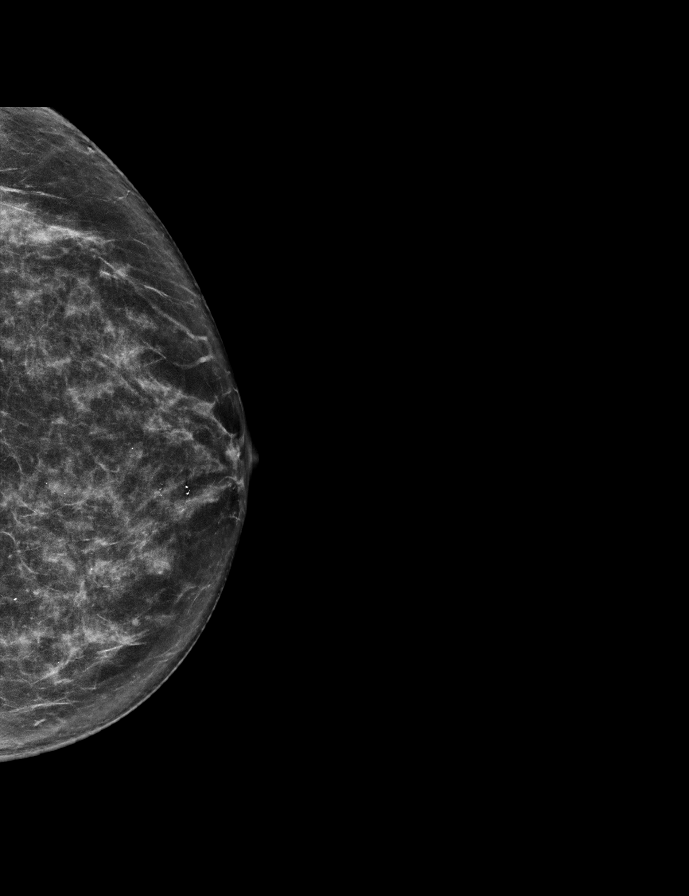

[R MLO tomo · 2 of 64 frames shown]
[frame 21/64]
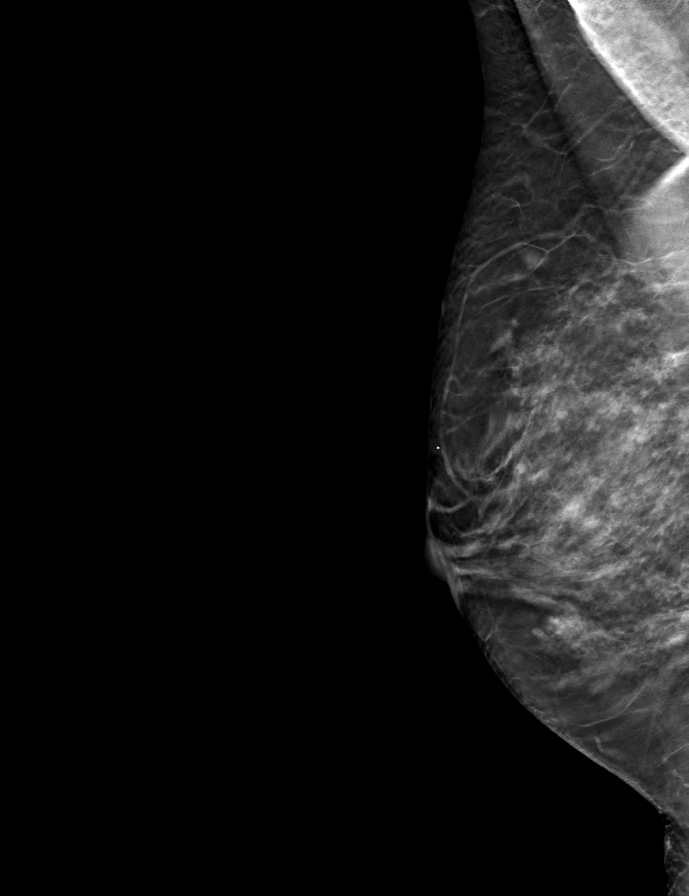
[frame 33/64]
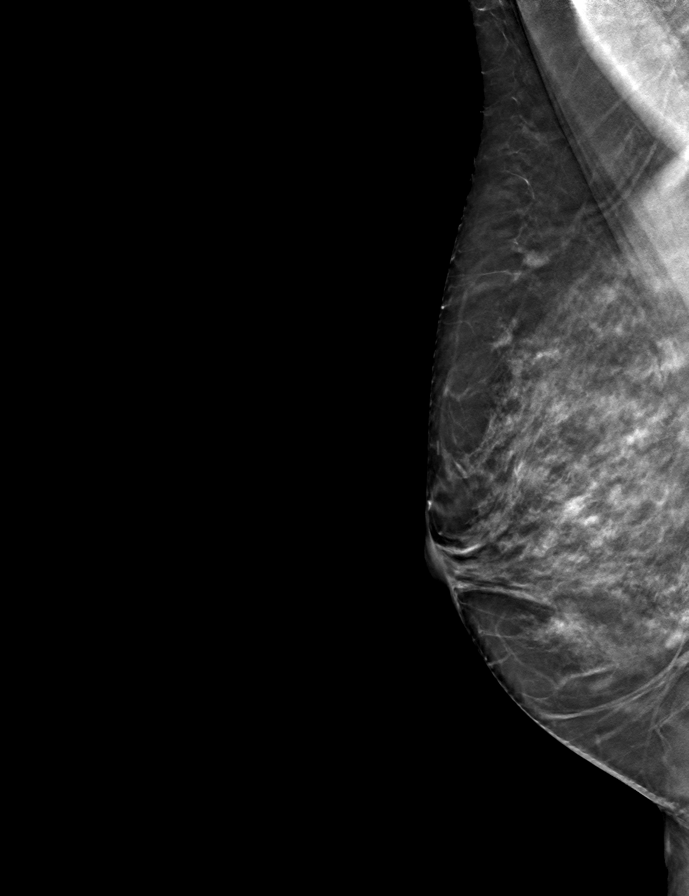

[L MLO tomo · tomo slice 33/66.0]
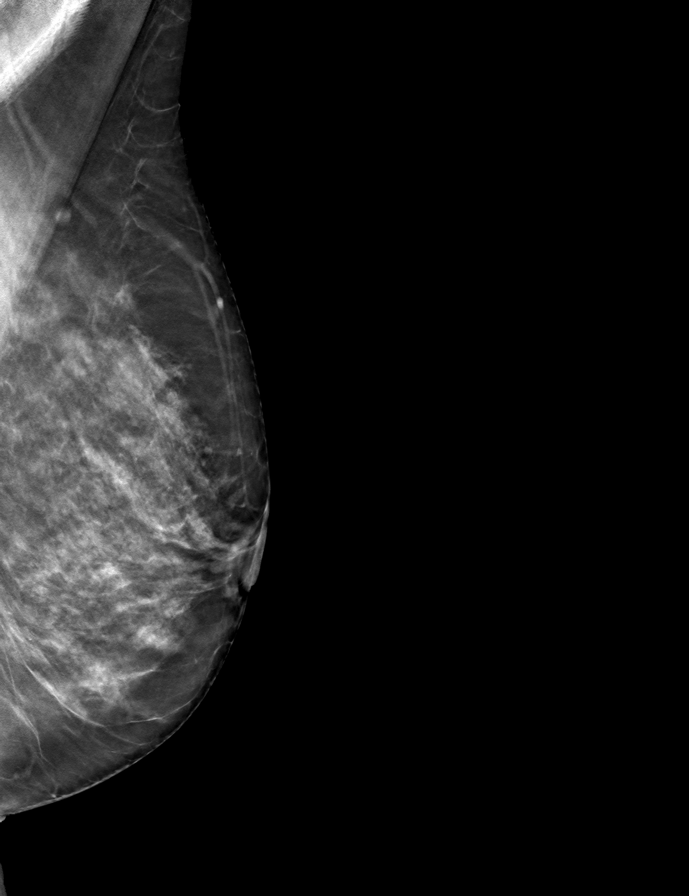

[R CC tomo · tomo slice 35/69.0]
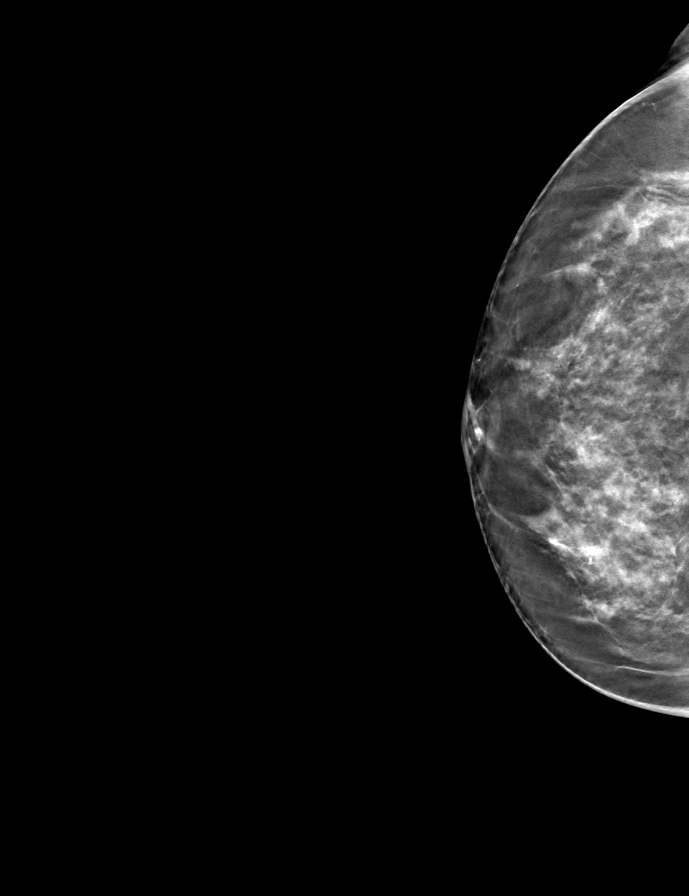

[L CC tomo · tomo slice 33/65.0]
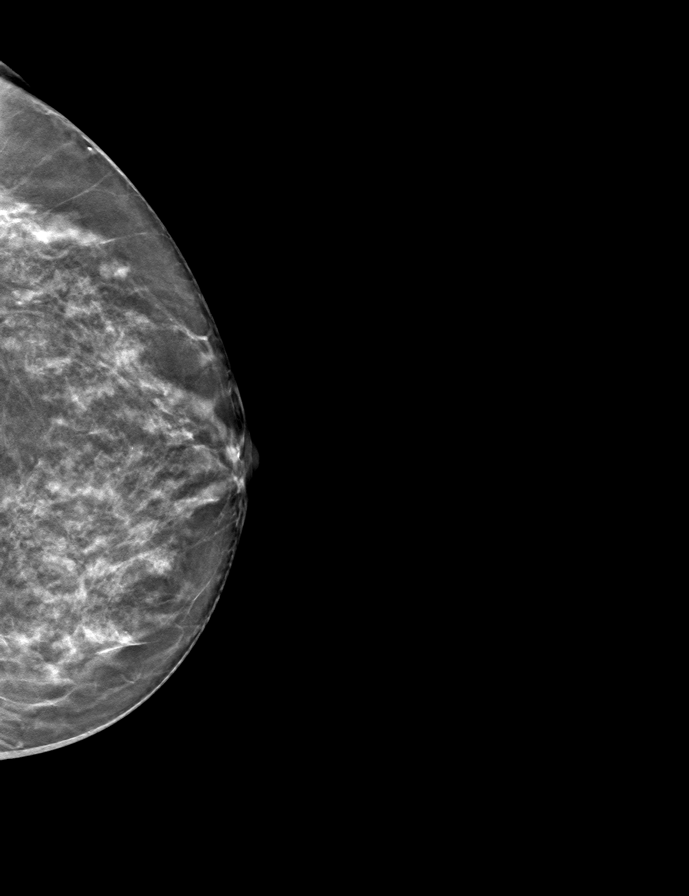

[9 of 24 positions shown; findings below may reference images not displayed]

ACR Breast Density Category c: The breast tissue is heterogeneously
dense, which may obscure small masses.
FINDINGS: In the right breast, a possible asymmetry warrants further
evaluation. In the left breast, no findings suspicious for
malignancy. Images were processed with CAD.
IMPRESSION: Further evaluation is suggested for possible asymmetry in the right
breast.

RECOMMENDATION:
Diagnostic mammogram and possibly ultrasound of the right breast.
(Code:EU-2-NNK)

The patient will be contacted regarding the findings, and additional
imaging will be scheduled.

BI-RADS CATEGORY  0: Incomplete. Need additional imaging evaluation
and/or prior mammograms for comparison.

## 2019-08-26 ENCOUNTER — Other Ambulatory Visit: Payer: Self-pay | Admitting: Family Medicine

## 2019-08-26 DIAGNOSIS — Z1231 Encounter for screening mammogram for malignant neoplasm of breast: Secondary | ICD-10-CM

## 2019-10-02 ENCOUNTER — Ambulatory Visit
Admission: RE | Admit: 2019-10-02 | Discharge: 2019-10-02 | Disposition: A | Discharge status: Home / Self Care | Payer: BC Managed Care – PPO | Source: Ambulatory Visit | Attending: Family Medicine | Admitting: Family Medicine

## 2019-10-02 ENCOUNTER — Other Ambulatory Visit: Payer: Self-pay

## 2019-10-02 DIAGNOSIS — Z1231 Encounter for screening mammogram for malignant neoplasm of breast: Secondary | ICD-10-CM

## 2021-01-03 ENCOUNTER — Other Ambulatory Visit: Payer: Self-pay | Admitting: Family Medicine

## 2021-01-03 DIAGNOSIS — Z1231 Encounter for screening mammogram for malignant neoplasm of breast: Secondary | ICD-10-CM

## 2021-01-08 ENCOUNTER — Ambulatory Visit: Payer: BC Managed Care – PPO

## 2021-01-31 ENCOUNTER — Ambulatory Visit: Payer: BC Managed Care – PPO

## 2021-03-07 ENCOUNTER — Ambulatory Visit: Payer: BC Managed Care – PPO

## 2021-04-18 ENCOUNTER — Ambulatory Visit
Admission: RE | Admit: 2021-04-18 | Discharge: 2021-04-18 | Disposition: A | Discharge status: Home / Self Care | Payer: BC Managed Care – PPO | Source: Ambulatory Visit | Attending: Family Medicine | Admitting: Family Medicine

## 2021-04-18 DIAGNOSIS — Z1231 Encounter for screening mammogram for malignant neoplasm of breast: Secondary | ICD-10-CM

## 2022-09-21 ENCOUNTER — Other Ambulatory Visit: Payer: Self-pay | Admitting: Obstetrics and Gynecology

## 2022-09-21 DIAGNOSIS — Z1231 Encounter for screening mammogram for malignant neoplasm of breast: Secondary | ICD-10-CM

## 2022-09-25 ENCOUNTER — Ambulatory Visit
Admission: RE | Admit: 2022-09-25 | Discharge: 2022-09-25 | Disposition: A | Discharge status: Home / Self Care | Payer: BC Managed Care – PPO | Source: Ambulatory Visit | Attending: Obstetrics and Gynecology | Admitting: Obstetrics and Gynecology

## 2022-09-25 DIAGNOSIS — Z1231 Encounter for screening mammogram for malignant neoplasm of breast: Secondary | ICD-10-CM

## 2023-02-27 DIAGNOSIS — I10 Essential (primary) hypertension: Secondary | ICD-10-CM | POA: Diagnosis not present

## 2023-04-26 DIAGNOSIS — Z1151 Encounter for screening for human papillomavirus (HPV): Secondary | ICD-10-CM | POA: Diagnosis not present

## 2023-04-26 DIAGNOSIS — Z01419 Encounter for gynecological examination (general) (routine) without abnormal findings: Secondary | ICD-10-CM | POA: Diagnosis not present

## 2023-04-26 DIAGNOSIS — N958 Other specified menopausal and perimenopausal disorders: Secondary | ICD-10-CM | POA: Diagnosis not present

## 2023-07-09 DIAGNOSIS — D485 Neoplasm of uncertain behavior of skin: Secondary | ICD-10-CM | POA: Diagnosis not present

## 2023-07-09 DIAGNOSIS — B351 Tinea unguium: Secondary | ICD-10-CM | POA: Diagnosis not present

## 2023-07-09 DIAGNOSIS — D2272 Melanocytic nevi of left lower limb, including hip: Secondary | ICD-10-CM | POA: Diagnosis not present

## 2023-07-09 DIAGNOSIS — Z1283 Encounter for screening for malignant neoplasm of skin: Secondary | ICD-10-CM | POA: Diagnosis not present

## 2023-07-09 DIAGNOSIS — D225 Melanocytic nevi of trunk: Secondary | ICD-10-CM | POA: Diagnosis not present

## 2023-07-09 DIAGNOSIS — D3613 Benign neoplasm of peripheral nerves and autonomic nervous system of lower limb, including hip: Secondary | ICD-10-CM | POA: Diagnosis not present

## 2023-07-18 DIAGNOSIS — K080 Exfoliation of teeth due to systemic causes: Secondary | ICD-10-CM | POA: Diagnosis not present

## 2023-09-24 DIAGNOSIS — G4700 Insomnia, unspecified: Secondary | ICD-10-CM | POA: Diagnosis not present

## 2023-09-24 DIAGNOSIS — I10 Essential (primary) hypertension: Secondary | ICD-10-CM | POA: Diagnosis not present

## 2023-09-24 DIAGNOSIS — R748 Abnormal levels of other serum enzymes: Secondary | ICD-10-CM | POA: Diagnosis not present

## 2023-09-24 DIAGNOSIS — K219 Gastro-esophageal reflux disease without esophagitis: Secondary | ICD-10-CM | POA: Diagnosis not present

## 2023-10-22 DIAGNOSIS — I10 Essential (primary) hypertension: Secondary | ICD-10-CM | POA: Diagnosis not present

## 2023-10-22 DIAGNOSIS — K219 Gastro-esophageal reflux disease without esophagitis: Secondary | ICD-10-CM | POA: Diagnosis not present

## 2023-10-22 DIAGNOSIS — G4709 Other insomnia: Secondary | ICD-10-CM | POA: Diagnosis not present

## 2023-11-27 DIAGNOSIS — K5900 Constipation, unspecified: Secondary | ICD-10-CM | POA: Diagnosis not present

## 2023-11-27 DIAGNOSIS — K602 Anal fissure, unspecified: Secondary | ICD-10-CM | POA: Diagnosis not present

## 2023-12-10 DIAGNOSIS — N9089 Other specified noninflammatory disorders of vulva and perineum: Secondary | ICD-10-CM | POA: Diagnosis not present

## 2024-01-21 DIAGNOSIS — K080 Exfoliation of teeth due to systemic causes: Secondary | ICD-10-CM | POA: Diagnosis not present

## 2024-02-07 DIAGNOSIS — R748 Abnormal levels of other serum enzymes: Secondary | ICD-10-CM | POA: Diagnosis not present

## 2024-02-11 DIAGNOSIS — K080 Exfoliation of teeth due to systemic causes: Secondary | ICD-10-CM | POA: Diagnosis not present
